# Patient Record
Sex: Male | Born: 1995 | Race: White | Hispanic: No | Smoking: Never smoker
Health system: Southern US, Community
[De-identification: ages and names within clinical notes are randomized; demographics above are authoritative.]

---
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
## 2020-03-31 ENCOUNTER — Other Ambulatory Visit: Payer: Self-pay

## 2020-03-31 ENCOUNTER — Encounter (HOSPITAL_COMMUNITY): Payer: Self-pay

## 2020-03-31 ENCOUNTER — Ambulatory Visit (HOSPITAL_COMMUNITY)
Admission: EM | Admit: 2020-03-31 | Discharge: 2020-03-31 | Disposition: A | Discharge status: Home / Self Care | Payer: BC Managed Care – PPO | Attending: Internal Medicine | Admitting: Internal Medicine

## 2020-03-31 DIAGNOSIS — W57XXXD Bitten or stung by nonvenomous insect and other nonvenomous arthropods, subsequent encounter: Secondary | ICD-10-CM | POA: Diagnosis not present

## 2020-03-31 DIAGNOSIS — A6920 Lyme disease, unspecified: Secondary | ICD-10-CM | POA: Insufficient documentation

## 2020-03-31 LAB — CBC WITH DIFFERENTIAL/PLATELET
Abs Immature Granulocytes: 0.01 10*3/uL (ref 0.00–0.07)
Basophils Absolute: 0 10*3/uL (ref 0.0–0.1)
Basophils Relative: 1 %
Eosinophils Absolute: 0.2 10*3/uL (ref 0.0–0.5)
Eosinophils Relative: 3 %
HCT: 45.6 % (ref 39.0–52.0)
Hemoglobin: 15.5 g/dL (ref 13.0–17.0)
Immature Granulocytes: 0 %
Lymphocytes Relative: 52 %
Lymphs Abs: 3.1 10*3/uL (ref 0.7–4.0)
MCH: 30.5 pg (ref 26.0–34.0)
MCHC: 34 g/dL (ref 30.0–36.0)
MCV: 89.6 fL (ref 80.0–100.0)
Monocytes Absolute: 0.5 10*3/uL (ref 0.1–1.0)
Monocytes Relative: 9 %
Neutro Abs: 2.1 10*3/uL (ref 1.7–7.7)
Neutrophils Relative %: 35 %
Platelets: 223 10*3/uL (ref 150–400)
RBC: 5.09 MIL/uL (ref 4.22–5.81)
RDW: 12.2 % (ref 11.5–15.5)
WBC: 6 10*3/uL (ref 4.0–10.5)
nRBC: 0 % (ref 0.0–0.2)

## 2020-03-31 MED ORDER — DOXYCYCLINE HYCLATE 100 MG PO CAPS: 100.0000 mg | ORAL_CAPSULE | Freq: Two times a day (BID) | ORAL | 0 refills | Status: AC

## 2020-03-31 NOTE — ED Triage Notes (Addendum)
Pt is here with a tick bite that occurred a week or more ago. Pt has not taken anything to relieve discomfort.

## 2020-04-01 LAB — B. BURGDORFI ANTIBODIES: B burgdorferi Ab IgG+IgM: <0.91 {ISR} (ref 0.00–0.90)

## 2020-04-01 NOTE — ED Provider Notes (Signed)
[facility]    CSN: [id number] Arrival date & time: 03/31/20  1011      History   Chief Complaint Chief Complaint  Patient presents with  . Tick Removal    HPI Mason Pena is a 24 y.o. male comes to urgent care with a rash on the right thigh.  Patient was bitten by a tick over 1 week ago.  According to the patient the tick was not engorged at the time of the bite.  He was able to remove the tick in its entirety.  A week after tick was removed patient developed a nonpruritic rash over the right thigh.  The rash has a ring of erythema with central clearing.  No swelling.  No fever, chills, joint aches.  Patient admits to having some fatigue.  No nausea vomiting or diarrhea.   HPI  History reviewed. No pertinent past medical history.  There are no problems to display for this patient.   History reviewed. No pertinent surgical history.     Home Medications    Prior to Admission medications   Medication Sig Start Date End Date Taking? Authorizing Provider  doxycycline (VIBRAMYCIN) 100 MG capsule Take 1 capsule (100 mg total) by mouth 2 (two) times daily. 03/31/20   LampteyMyrene Galas, MD    Family History Family History  Problem Relation Age of Onset  . Healthy Mother   . Healthy Father     Social History Social History   Tobacco Use  . Smoking status: Never Smoker  . Smokeless tobacco: Never Used  Substance Use Topics  . Alcohol use: Not Currently  . Drug use: Never     Allergies   Patient has no known allergies.   Review of Systems Review of Systems  Constitutional: Positive for activity change and fatigue. Negative for chills and fever.  HENT: Negative.   Respiratory: Negative.   Cardiovascular: Negative.   Gastrointestinal: Negative.   Genitourinary: Negative.   Musculoskeletal: Negative for arthralgias, joint swelling and myalgias.  Skin: Positive for rash.  Neurological: Negative for dizziness, facial asymmetry, light-headedness and  headaches.     Physical Exam Triage Vital Signs ED Triage Vitals  Enc Vitals Group     BP 03/31/20 1046 107/62     Pulse Rate 03/31/20 1046 70     Resp 03/31/20 1046 18     Temp 03/31/20 1046 97.9 F (36.6 C)     Temp Source 03/31/20 1046 Oral     SpO2 03/31/20 1046 98 %     Weight --      Height --      Head Circumference --      Peak Flow --      Pain Score 03/31/20 1043 0     Pain Loc --      Pain Edu? --      Excl. in [place]? --    No data found.  Updated Vital Signs BP 107/62 (BP Location: Right Arm)   Pulse 70   Temp 97.9 F (36.6 C) (Oral)   Resp 18   SpO2 98%   Visual Acuity Right Eye Distance:   Left Eye Distance:   Bilateral Distance:    Right Eye Near:   Left Eye Near:    Bilateral Near:     Physical Exam Vitals and nursing note reviewed.  Constitutional:      Appearance: Normal appearance.  Cardiovascular:     Rate and Rhythm: Normal rate and regular rhythm.  Pulses: Normal pulses.     Heart sounds: Normal heart sounds.  Pulmonary:     Effort: Pulmonary effort is normal.     Breath sounds: Normal breath sounds. No wheezing or rhonchi.  Abdominal:     General: Bowel sounds are normal.     Palpations: Abdomen is soft.  Musculoskeletal:        General: Normal range of motion.  Skin:    General: Skin is warm.     Capillary Refill: Capillary refill takes less than 2 seconds.     Findings: Lesion and rash present.  Neurological:     Mental Status: He is alert.          UC Treatments / Results  Labs (all labs ordered are listed, but only abnormal results are displayed) Labs Reviewed  CBC WITH DIFFERENTIAL/PLATELET  B. BURGDORFI ANTIBODIES    EKG   Radiology No results found.  Procedures Procedures (including critical care time)  Medications Ordered in UC Medications - No data to display  Initial Impression / Assessment and Plan / UC Course  I have reviewed the triage vital signs and the nursing notes.  Pertinent labs  & imaging results that were available during my care of the patient were reviewed by me and considered in my medical decision making (see chart for details).     1.  Erythema migrans: Doxycycline 100 mg twice daily for 10 days Lyme disease serology Return precautions given We will wait for test results for further management. Final Clinical Impressions(s) / UC Diagnoses   Final diagnoses:  Erythema migrans (Lyme disease)  Tick bite, subsequent encounter   Discharge Instructions   None    ED Prescriptions    Medication Sig Dispense Auth. Provider   doxycycline (VIBRAMYCIN) 100 MG capsule Take 1 capsule (100 mg total) by mouth 2 (two) times daily. 20 capsule Jayvan Mcshan, Britta Mccreedy, MD     PDMP not reviewed this encounter.   Merrilee Jansky, MD 04/01/20 [id number]
